# Patient Record
Sex: Male | Born: 1995 | Race: White | Hispanic: No | Marital: Single | Smoking: Never smoker
Health system: Southern US, Community
[De-identification: ages and names within clinical notes are randomized; demographics above are authoritative.]

## PROBLEM LIST (undated history)

## (undated) DIAGNOSIS — T7840XA Allergy, unspecified, initial encounter: Secondary | ICD-10-CM

## (undated) HISTORY — DX: Allergy, unspecified, initial encounter: T78.40XA

---
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
## 2015-09-20 ENCOUNTER — Encounter (HOSPITAL_COMMUNITY): Payer: Self-pay | Admitting: *Deleted

## 2015-09-20 ENCOUNTER — Emergency Department (HOSPITAL_COMMUNITY)
Admission: EM | Admit: 2015-09-20 | Discharge: 2015-09-20 | Disposition: A | Discharge status: Home / Self Care | Payer: Self-pay | Attending: Emergency Medicine | Admitting: Emergency Medicine

## 2015-09-20 DIAGNOSIS — B354 Tinea corporis: Secondary | ICD-10-CM | POA: Insufficient documentation

## 2015-09-20 MED ORDER — NAFTIFINE HCL 2 % EX CREA: TOPICAL_CREAM | CUTANEOUS | Status: DC

## 2015-09-20 MED ORDER — TRIAMCINOLONE ACETONIDE 0.1 % EX CREA: 1.0000 "application " | TOPICAL_CREAM | Freq: Two times a day (BID) | CUTANEOUS | Status: DC

## 2015-09-20 NOTE — ED Notes (Signed)
The pt reports that hed has had ring worm for 3 weeks on his rt wrist

## 2015-09-20 NOTE — Discharge Instructions (Signed)
Apply antifungal cream as prescribed including a half an inch margin around the wound daily. Apply steroid cream for 3-5 days to help with inflammation. Do not use longer than that. Follow-up with a family doctor or dermatologist for recheck   Body Ringworm Ringworm (tinea corporis) is a fungal infection of the skin on the body. This infection is not caused by worms, but is actually caused by a fungus. Fungus normally lives on the top of your skin and can be useful. However, in the case of ringworms, the fungus grows out of control and causes a skin infection. It can involve any area of skin on the body and can spread easily from one person to another (contagious). Ringworm is a common problem for children, but it can affect adults as well. Ringworm is also often found in athletes, especially wrestlers who share equipment and mats.  CAUSES  Ringworm of the body is caused by a fungus called dermatophyte. It can spread by:  Touchingother people who are infected.  Touchinginfected pets.  Touching or sharingobjects that have been in contact with the infected person or pet (hats, combs, towels, clothing, sports equipment). SYMPTOMS   Itchy, raised red spots and bumps on the skin.  Ring-shaped rash.  Redness near the border of the rash with a clear center.  Dry and scaly skin on or around the rash. Not every person develops a ring-shaped rash. Some develop only the red, scaly patches. DIAGNOSIS  Most often, ringworm can be diagnosed by performing a skin exam. Your caregiver may choose to take a skin scraping from the affected area. The sample will be examined under the microscope to see if the fungus is present.  TREATMENT  Body ringworm may be treated with a topical antifungal cream or ointment. Sometimes, an antifungal shampoo that can be used on your body is prescribed. You may be prescribed antifungal medicines to take by mouth if your ringworm is severe, keeps coming back, or lasts a long  time.  HOME CARE INSTRUCTIONS   Only take over-the-counter or prescription medicines as directed by your caregiver.  Wash the infected area and dry it completely before applying yourcream or ointment.  When using antifungal shampoo to treat the ringworm, leave the shampoo on the body for 3-5 minutes before rinsing.   Wear loose clothing to stop clothes from rubbing and irritating the rash.  Wash or change your bed sheets every night while you have the rash.  Have your pet treated by your veterinarian if it has the same infection. To prevent ringworm:   Practice good hygiene.  Wear sandals or shoes in public places and showers.  Do not share personal items with others.  Avoid touching red patches of skin on other people.  Avoid touching pets that have bald spots or wash your hands after doing so. SEEK MEDICAL CARE IF:   Your rash continues to spread after 7 days of treatment.  Your rash is not gone in 4 weeks.  The area around your rash becomes red, warm, tender, and swollen.   This information is not intended to replace advice given to you by your health care provider. Make sure you discuss any questions you have with your health care provider.   Document Released: 07/11/2000 Document Revised: 04/07/2012 Document Reviewed: 01/26/2012 Elsevier Interactive Patient Education [employer]

## 2015-09-20 NOTE — ED Provider Notes (Signed)
CSN: [id number]     Arrival date & time 09/20/15  2150 History  By signing my name below, I, Tanda Rockers, attest that this documentation has been prepared under the direction and in the presence of Rathana Viveros, PA-C.  Electronically Signed: Tanda Rockers, ED Scribe. 09/20/2015. 10:39 PM.   Chief Complaint  Patient presents with  . Rash   The history is provided by the patient. No language interpreter was used.     HPI Comments: Norman Flynn is a 20 y.o. male who presents to the Emergency Department complaining of gradual onset, constant, itching rash to right wrist x 3 weeks. Pt believes he had ring worm due to one of his friend's having a similar rash in the past. He spoke to a pharmacist at [facility] and was given OTC fungal foot cream to use. He states he has been placing the cream to the area multiples time per day for 3 weeks and has been taking Amoxicillin without relief. Denies drainage, fever, chills, or any other associated symptoms.   History reviewed. No pertinent past medical history. History reviewed. No pertinent past surgical history. No family history on file. Social History  Substance Use Topics  . Smoking status: Never Smoker   . Smokeless tobacco: None  . Alcohol Use: No    Review of Systems  Constitutional: Negative for fever and chills.  Skin: Positive for rash (right wrist).       Negative for drainage   Allergies  Review of patient's allergies indicates no known allergies.  Home Medications   Prior to Admission medications   Not on File   BP 151/90 mmHg  Pulse 76  Temp(Src) 98.4 F (36.9 C)  Resp 16  Ht 6' (1.829 m)  Wt 260 lb 3 oz (118.02 kg)  BMI 35.28 kg/m2  SpO2 98%   Physical Exam  Constitutional: He is oriented to person, place, and time. He appears well-developed and well-nourished. No distress.  HENT:  Head: Normocephalic and atraumatic.  Eyes: Conjunctivae and EOM are normal.  Neck: Neck supple. No tracheal deviation present.   Cardiovascular: Normal rate, regular rhythm and normal heart sounds.   Pulmonary/Chest: Effort normal and breath sounds normal. No respiratory distress. He has no wheezes. He has no rales.  Musculoskeletal: Normal range of motion.  Neurological: He is alert and oriented to person, place, and time.  Skin: Skin is warm and dry.  5x5 cm erythematous scaly rash to the dorsal distal right forearm. Area is erythematous, scaly, with well demarcated border. There is tiny satellite lesions around the wound. It is nontender. No drainage. Not warm to the touch.  Psychiatric: He has a normal mood and affect. His behavior is normal.  Nursing note and vitals reviewed.   ED Course  Procedures (including critical care time)  DIAGNOSTIC STUDIES: Oxygen Saturation is 98% on RA, normal by my interpretation.    COORDINATION OF CARE: 10:39 PM-Discussed treatment plan with pt at bedside and pt agreed to plan.   Labs Review Labs Reviewed - No data to display  Imaging Review No results found.   EKG Interpretation None      MDM   Final diagnoses:  Tinea corporis   Patients with a rash to the right dorsal wrist consistent with tinea corporis. Will start on antifungal cream, Kenalog cream, follow-up with primary care doctor or dermatology. There is no evidence of infectious process, no evidence of cellulitis, no drainage. Wound is itchy.  Filed Vitals:   09/20/15 2204  BP:  151/90  Pulse: 76  Temp: 98.4 F (36.9 C)  Resp: 16  Height: 6' (1.829 m)  Weight: 118.02 kg  SpO2: 98%     Jaynie Crumble, PA-C 09/20/15 2257  Nelva Nay, MD 09/24/15 1344

## 2016-05-20 ENCOUNTER — Emergency Department: Payer: Worker's Compensation

## 2016-05-20 ENCOUNTER — Emergency Department
Admission: EM | Admit: 2016-05-20 | Discharge: 2016-05-20 | Disposition: A | Discharge status: Home / Self Care | Payer: Worker's Compensation | Attending: Emergency Medicine | Admitting: Emergency Medicine

## 2016-05-20 DIAGNOSIS — S4991XA Unspecified injury of right shoulder and upper arm, initial encounter: Secondary | ICD-10-CM | POA: Diagnosis present

## 2016-05-20 DIAGNOSIS — Y9389 Activity, other specified: Secondary | ICD-10-CM | POA: Insufficient documentation

## 2016-05-20 DIAGNOSIS — Z87828 Personal history of other (healed) physical injury and trauma: Secondary | ICD-10-CM | POA: Insufficient documentation

## 2016-05-20 DIAGNOSIS — M25511 Pain in right shoulder: Secondary | ICD-10-CM

## 2016-05-20 DIAGNOSIS — Y929 Unspecified place or not applicable: Secondary | ICD-10-CM | POA: Insufficient documentation

## 2016-05-20 DIAGNOSIS — Y999 Unspecified external cause status: Secondary | ICD-10-CM | POA: Diagnosis not present

## 2016-05-20 MED ORDER — KETOROLAC TROMETHAMINE 60 MG/2ML IM SOLN
INTRAMUSCULAR | Status: AC
  Administered 2016-05-20: 60 mg via INTRAMUSCULAR
  Filled 2016-05-20: qty 2

## 2016-05-20 MED ORDER — KETOROLAC TROMETHAMINE 60 MG/2ML IM SOLN
60.0000 mg | Freq: Once | INTRAMUSCULAR | Status: AC
  Administered 2016-05-20: 60 mg via INTRAMUSCULAR

## 2016-05-20 NOTE — ED Notes (Signed)
Pt discharged to home.  Discharge instructions reviewed.  Verbalized understanding.  No questions or concerns at this time.  Teach back verified.  Pt in NAD.  No items left in ED.   
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
## 2016-12-27 ENCOUNTER — Encounter (HOSPITAL_COMMUNITY): Payer: Self-pay | Admitting: *Deleted

## 2016-12-27 ENCOUNTER — Ambulatory Visit (HOSPITAL_COMMUNITY)
Admission: EM | Admit: 2016-12-27 | Discharge: 2016-12-27 | Disposition: A | Discharge status: Home / Self Care | Payer: BLUE CROSS/BLUE SHIELD | Attending: Internal Medicine | Admitting: Internal Medicine

## 2016-12-27 DIAGNOSIS — L500 Allergic urticaria: Secondary | ICD-10-CM | POA: Diagnosis not present

## 2016-12-27 DIAGNOSIS — R21 Rash and other nonspecific skin eruption: Secondary | ICD-10-CM | POA: Diagnosis not present

## 2016-12-27 MED ORDER — METHYLPREDNISOLONE SODIUM SUCC 125 MG IJ SOLR
125.0000 mg | Freq: Once | INTRAMUSCULAR | Status: AC
  Administered 2016-12-27: 125 mg via INTRAMUSCULAR

## 2016-12-27 MED ORDER — METHYLPREDNISOLONE SODIUM SUCC 125 MG IJ SOLR
INTRAMUSCULAR | Status: AC
  Filled 2016-12-27: qty 2

## 2016-12-27 MED ORDER — DIPHENHYDRAMINE HCL 50 MG/ML IJ SOLN
INTRAMUSCULAR | Status: AC
  Filled 2016-12-27: qty 1

## 2016-12-27 MED ORDER — HYDROXYZINE HCL 25 MG PO TABS: ORAL_TABLET | ORAL | 0 refills | Status: DC

## 2016-12-27 MED ORDER — DIPHENHYDRAMINE HCL 50 MG/ML IJ SOLN
50.0000 mg | Freq: Once | INTRAMUSCULAR | Status: AC
  Administered 2016-12-27: 50 mg via INTRAMUSCULAR

## 2016-12-27 MED ORDER — PREDNISONE 10 MG PO TABS: ORAL_TABLET | ORAL | 0 refills | Status: DC

## 2016-12-27 NOTE — ED Provider Notes (Signed)
CSN: [id number]     Arrival date & time 12/27/16  1708 History   First MD Initiated Contact with Patient 12/27/16 1732     Chief Complaint  Patient presents with  . Urticaria   (Consider location/radiation/quality/duration/timing/severity/associated sxs/prior Treatment) Patient awoke this afternoon c/o welts itching and rash on back.   The history is provided by the patient.  Urticaria  This is a new problem. The problem occurs constantly. The problem has not changed since onset.Nothing aggravates the symptoms. He has tried nothing for the symptoms.    History reviewed. No pertinent past medical history. History reviewed. No pertinent surgical history. No family history on file. Social History  Substance Use Topics  . Smoking status: Never Smoker  . Smokeless tobacco: Never Used  . Alcohol use No    Review of Systems  Constitutional: Negative.   HENT: Negative.   Eyes: Negative.   Respiratory: Negative.   Cardiovascular: Negative.   Gastrointestinal: Negative.   Endocrine: Negative.   Genitourinary: Negative.   Musculoskeletal: Negative.   Skin: Positive for rash.  Allergic/Immunologic: Negative.   Neurological: Negative.   Hematological: Negative.   Psychiatric/Behavioral: Negative.     Allergies  Other  Home Medications   Prior to Admission medications   Medication Sig Start Date End Date Taking? Authorizing Provider  hydrOXYzine (ATARAX/VISTARIL) 25 MG tablet Take one to two po q 6 hours prn itching 12/27/16   Deatra Canterxford, Rustyn Conery J, FNP  Naftifine HCl 2 % CREA Apply topically 1-2 times daily, including 0.5in margin around the wound 09/20/15   Kirichenko, Lemont Fillersatyana, PA-C  predniSONE (DELTASONE) 10 MG tablet Take 6-5-4-3-2-1 po qd 12/27/16   Deatra Canterxford, Ford Peddie J, FNP  triamcinolone cream (KENALOG) 0.1 % Apply 1 application topically 2 (two) times daily. 09/20/15   Kirichenko, Lemont Fillersatyana, PA-C   Meds Ordered and Administered this Visit   Medications  methylPREDNISolone sodium  succinate (SOLU-MEDROL) 125 mg/2 mL injection 125 mg (not administered)  diphenhydrAMINE (BENADRYL) injection 50 mg (not administered)    BP (!) 141/79   Pulse 63   Temp 98.3 F (36.8 C) (Oral)   Resp 16   SpO2 96%  No data found.   Physical Exam  Constitutional: He appears well-developed and well-nourished.  HENT:  Head: Normocephalic and atraumatic.  Eyes: Conjunctivae and EOM are normal. Pupils are equal, round, and reactive to light.  Neck: Normal range of motion.  Cardiovascular: Normal rate, regular rhythm and normal heart sounds.   Pulmonary/Chest: Effort normal and breath sounds normal.  Skin:  Urticaria rash on back  Nursing note and vitals reviewed.   Urgent Care Course     Procedures (including critical care time)  Labs Review Labs Reviewed - No data to display  Imaging Review No results found.   Visual Acuity Review  Right Eye Distance:   Left Eye Distance:   Bilateral Distance:    Right Eye Near:   Left Eye Near:    Bilateral Near:         MDM   1. Allergic urticaria    Solumedrol 125mg  IM Benadryl 50mg  IM  Prednisone 10 mg 6-5-4-3-2-1 po qd #21 Hydroxyzine 25mg  one to two po qid prn #28      Deatra CanterOxford, Giovannina Mun J, FNP 12/27/16 1753

## 2016-12-27 NOTE — ED Triage Notes (Signed)
C/O waking up noticing hives to back and shoulder this AM.

## 2017-02-01 ENCOUNTER — Ambulatory Visit (HOSPITAL_COMMUNITY)
Admission: EM | Admit: 2017-02-01 | Discharge: 2017-02-01 | Disposition: A | Discharge status: Home / Self Care | Payer: Worker's Compensation | Attending: Family Medicine | Admitting: Family Medicine

## 2017-02-01 ENCOUNTER — Encounter (HOSPITAL_COMMUNITY): Payer: Self-pay | Admitting: Emergency Medicine

## 2017-02-01 DIAGNOSIS — S4991XA Unspecified injury of right shoulder and upper arm, initial encounter: Secondary | ICD-10-CM
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
## 2017-02-01 NOTE — Discharge Instructions (Addendum)
Today you were diagnosed with the following: 1. Shoulder injury, right, initial encounter    You have not been prescribed prescription medications this visit.  Please call [facility] for follow up appointment. You may use over the counter ibuprofen or acetaminophen as needed.

## 2017-09-20 DIAGNOSIS — H6693 Otitis media, unspecified, bilateral: Secondary | ICD-10-CM | POA: Diagnosis not present

## 2017-09-23 DIAGNOSIS — H6693 Otitis media, unspecified, bilateral: Secondary | ICD-10-CM | POA: Diagnosis not present

## 2017-11-11 ENCOUNTER — Ambulatory Visit: Payer: Self-pay | Admitting: Internal Medicine

## 2017-12-18 ENCOUNTER — Ambulatory Visit (INDEPENDENT_AMBULATORY_CARE_PROVIDER_SITE_OTHER): Payer: 59 | Admitting: Family Medicine

## 2017-12-18 ENCOUNTER — Encounter: Payer: Self-pay | Admitting: Family Medicine

## 2017-12-18 VITALS — BP 134/88 | HR 73 | Temp 98.5°F | Ht 69.75 in | Wt 262.0 lb

## 2017-12-18 DIAGNOSIS — Z6837 Body mass index (BMI) 37.0-37.9, adult: Secondary | ICD-10-CM

## 2017-12-18 DIAGNOSIS — Z8249 Family history of ischemic heart disease and other diseases of the circulatory system: Secondary | ICD-10-CM

## 2017-12-18 DIAGNOSIS — Z8659 Personal history of other mental and behavioral disorders: Secondary | ICD-10-CM

## 2017-12-18 DIAGNOSIS — E6609 Other obesity due to excess calories: Secondary | ICD-10-CM | POA: Diagnosis not present

## 2017-12-18 DIAGNOSIS — Z0000 Encounter for general adult medical examination without abnormal findings: Secondary | ICD-10-CM

## 2017-12-18 NOTE — Progress Notes (Signed)
Subjective:    Patient ID: Norman Flynn, male    DOB: 06/17/1996, 22 y.o.   MRN: [id number]  HPI This is a 22 yo male who presents today to establish care. 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   He was diagnosed with ADHD as child, took vyvanse. Is interested in being retested to see if medication would help his inattentiveness.   Last CPE- several years Td- 02/24/13 Flu- annual Dental- regular Eye- eye exam for work Exercise-4-5 days a week Sleep- 6-8 hours a night Sexuality- has not been sexually active in over 3 years Diet- eats fast food approximately 2x per week. Tries to prepare most meals. No soda/juice/sweet tea. Frequently skips meals.   Past Medical History:  Diagnosis Date  . Allergy    No past surgical history on file. Family History  Problem Relation Age of Onset  . Cancer Mother   . Heart disease Father    Social History   Tobacco Use  . Smoking status: Never Smoker  . Smokeless tobacco: Never Used  Substance Use Topics  . Alcohol use: No  . Drug use: No      Review of Systems  Allergic/Immunologic: Positive for environmental allergies.  Neurological:       Difficulty concentrating.   All other systems reviewed and are negative.      Objective:   Physical Exam Physical Exam  Constitutional: He is oriented to person, place, and time. He appears well-developed and well-nourished.  HENT:  Head: Normocephalic and atraumatic.  Right Ear: External ear normal.  Left Ear: External ear normal.  Nose: Nose normal.  Mouth/Throat: Oropharynx is clear and moist.  Eyes: Conjunctivae are normal. Pupils are equal, round, and reactive to light.  Neck: Normal range of motion. Neck supple.  Cardiovascular: Normal rate, regular rhythm, normal heart sounds and intact distal pulses.   Pulmonary/Chest: Effort normal and breath sounds normal.  Abdominal: Soft. Bowel sounds are normal. Hernia confirmed negative in the right inguinal area and  confirmed negative in the left inguinal area.  Genitourinary: Testes normal and penis normal. Circumcised.  Musculoskeletal: Normal range of motion. He exhibits no edema or tenderness.       Cervical back: Normal.       Thoracic back: Normal.       Lumbar back: Normal.  Lymphadenopathy:    He has no cervical adenopathy.       Right: No inguinal adenopathy present.       Left: No inguinal adenopathy present.  Neurological: He is alert and oriented to person, place, and time. He has normal reflexes.  Skin: Skin is warm and dry.  Psychiatric: He has a normal mood and affect. His behavior is normal. Judgment normal.  Vitals reviewed.     BP 134/88 (BP Location: Right Arm, Patient Position: Sitting, Cuff Size: Large)   Pulse 73   Temp 98.5 F (36.9 C) (Oral)   Ht 5' 9.75" (1.772 m)   Wt 262 lb (118.8 kg)   SpO2 97%   BMI 37.86 kg/m  Wt Readings from Last 3 Encounters:  12/18/17 262 lb (118.8 kg)  09/20/15 260 lb 3 oz (118 kg)       Assessment & Plan:  1. Annual physical exam - Discussed and encouraged healthy lifestyle choices- adequate sleep, regular exercise, stress management and healthy food choices.  - Comprehensive metabolic panel; Future - Lipid panel; Future - TSH; Future - Hemoglobin A1c; Future  2. History of ADHD -  Ambulatory referral to Psychology- for psycho-educational testing  3. Class 2 obesity due to excess calories without serious comorbidity with body mass index (BMI) of 37.0 to 37.9 in adult - discussed making healthy food choices, increasing vegetables, lean proteins, regular meals/snacks  4. Family history of early CAD - labs per #1   Olean Ree, FNP-BC  [facility]  12/18/2017 5:27 PM

## 2017-12-18 NOTE — Patient Instructions (Signed)
It was a pleasure to meet you today! I look forward to partnering with you for your health care needs   Please stop at the desk to schedule testing for ADHD- follow up with me after testing  Increase your vegetable intake, try to spread calories throughout day  Keeping you healthy  Get these tests  Blood pressure- Have your blood pressure checked once a year by your healthcare provider.  Normal blood pressure is 120/80.  Weight- Have your body mass index (BMI) calculated to screen for obesity.  BMI is a measure of body fat based on height and weight. You can also calculate your own BMI at https://www.west-esparza.com/.  Cholesterol- Have your cholesterol checked regularly starting at age 18, sooner may be necessary if you have diabetes, high blood pressure, if a family member developed heart diseases at an early age or if you smoke.   Chlamydia, HIV, and other sexual transmitted disease- Get screened each year until the age of 46 then within three months of each new sexual partner.  Diabetes- Have your blood sugar checked regularly if you have high blood pressure, high cholesterol, a family history of diabetes or if you are overweight.  Get these vaccines  Flu shot- Every fall.  Tetanus shot- Every 10 years.  Menactra- Single dose; prevents meningitis.  Take these steps  Don't smoke- If you do smoke, ask your healthcare provider about quitting. For tips on how to quit, go to www.smokefree.gov or call 1-800-QUIT-NOW.  Be physically active- Exercise 5 days a week for at least 30 minutes.  If you are not already physically active start slow and gradually work up to 30 minutes of moderate physical activity.  Examples of moderate activity include walking briskly, mowing the yard, dancing, swimming bicycling, etc.  Eat a healthy diet- Eat a variety of healthy foods such as fruits, vegetables, low fat milk, low fat cheese, yogurt, lean meats, poultry, fish, beans, tofu, etc.  For more  information on healthy eating, go to www.thenutritionsource.org  Drink alcohol in moderation- Limit alcohol intake two drinks or less a day.  Never drink and drive.  Dentist- Brush and floss teeth twice daily; visit your dentis twice a year.  Depression-Your emotional health is as important as your physical health.  If you're feeling down, losing interest in things you normally enjoy please talk with your healthcare provider.  Gun Safety- If you keep a gun in your home, keep it unloaded and with the safety lock on.  Bullets should be stored separately.  Helmet use- Always wear a helmet when riding a motorcycle, bicycle, rollerblading or skateboarding.  Safe sex- If you may be exposed to a sexually transmitted infection, use a condom  Seat belts- Seat bels can save your life; always wear one.  Smoke/Carbon Monoxide detectors- These detectors need to be installed on the appropriate level of your home.  Replace batteries at least once a year.  Skin Cancer- When out in the sun, cover up and use sunscreen SPF 15 or higher.  Violence- If anyone is threatening or hurting you, please tell your healthcare provider.

## 2017-12-23 ENCOUNTER — Other Ambulatory Visit: Payer: 59

## 2017-12-24 ENCOUNTER — Other Ambulatory Visit: Payer: 59

## 2018-01-26 ENCOUNTER — Other Ambulatory Visit (INDEPENDENT_AMBULATORY_CARE_PROVIDER_SITE_OTHER): Payer: 59

## 2018-01-26 DIAGNOSIS — Z0000 Encounter for general adult medical examination without abnormal findings: Secondary | ICD-10-CM | POA: Diagnosis not present

## 2018-01-27 LAB — LIPID PANEL
CHOLESTEROL: 261 mg/dL — AB (ref 0–200)
HDL: 58.3 mg/dL (ref >39.00)
LDL Cholesterol: 183 mg/dL — ABNORMAL HIGH (ref 0–99)
NONHDL: 203.05
Total CHOL/HDL Ratio: 4
Triglycerides: 99 mg/dL (ref 0.0–149.0)
VLDL: 19.8 mg/dL (ref 0.0–40.0)

## 2018-01-27 LAB — COMPREHENSIVE METABOLIC PANEL
ALK PHOS: 70 U/L (ref 39–117)
ALT: 28 U/L (ref 0–53)
AST: 19 U/L (ref 0–37)
Albumin: 4.6 g/dL (ref 3.5–5.2)
BILIRUBIN TOTAL: 0.5 mg/dL (ref 0.2–1.2)
BUN: 13 mg/dL (ref 6–23)
CO2: 29 mEq/L (ref 19–32)
Calcium: 9.8 mg/dL (ref 8.4–10.5)
Chloride: 101 mEq/L (ref 96–112)
Creatinine, Ser: 0.89 mg/dL (ref 0.40–1.50)
GFR: 112.66 mL/min (ref >60.00)
GLUCOSE: 93 mg/dL (ref 70–99)
Potassium: 4.5 mEq/L (ref 3.5–5.1)
Sodium: 139 mEq/L (ref 135–145)
TOTAL PROTEIN: 7.3 g/dL (ref 6.0–8.3)

## 2018-01-27 LAB — HEMOGLOBIN A1C: Hgb A1c MFr Bld: 5.6 % (ref 4.6–6.5)

## 2018-01-27 LAB — TSH: TSH: 2.65 u[IU]/mL (ref 0.35–4.50)

## 2018-03-30 ENCOUNTER — Ambulatory Visit: Payer: 59 | Admitting: Psychology

## 2018-04-09 ENCOUNTER — Ambulatory Visit: Payer: Self-pay | Admitting: Family Medicine

## 2018-07-08 ENCOUNTER — Encounter: Payer: Self-pay | Admitting: Family Medicine

## 2018-07-13 ENCOUNTER — Ambulatory Visit: Payer: 59 | Admitting: Internal Medicine

## 2018-07-13 DIAGNOSIS — Z0289 Encounter for other administrative examinations: Secondary | ICD-10-CM

## 2018-10-07 ENCOUNTER — Emergency Department (HOSPITAL_COMMUNITY): Payer: No Typology Code available for payment source

## 2018-10-07 ENCOUNTER — Emergency Department (HOSPITAL_COMMUNITY)
Admission: EM | Admit: 2018-10-07 | Discharge: 2018-10-08 | Disposition: A | Discharge status: Home / Self Care | Payer: No Typology Code available for payment source | Attending: Emergency Medicine | Admitting: Emergency Medicine

## 2018-10-07 ENCOUNTER — Encounter (HOSPITAL_COMMUNITY): Payer: Self-pay

## 2018-10-07 DIAGNOSIS — W19XXXA Unspecified fall, initial encounter: Secondary | ICD-10-CM | POA: Insufficient documentation

## 2018-10-07 DIAGNOSIS — Y990 Civilian activity done for income or pay: Secondary | ICD-10-CM | POA: Insufficient documentation

## 2018-10-07 DIAGNOSIS — S60819A Abrasion of unspecified wrist, initial encounter: Secondary | ICD-10-CM | POA: Insufficient documentation

## 2018-10-07 DIAGNOSIS — S6990XA Unspecified injury of unspecified wrist, hand and finger(s), initial encounter: Secondary | ICD-10-CM | POA: Diagnosis present

## 2018-10-07 DIAGNOSIS — Y929 Unspecified place or not applicable: Secondary | ICD-10-CM | POA: Insufficient documentation

## 2018-10-07 DIAGNOSIS — Z7721 Contact with and (suspected) exposure to potentially hazardous body fluids: Secondary | ICD-10-CM | POA: Diagnosis not present

## 2018-10-07 DIAGNOSIS — Z79899 Other long term (current) drug therapy: Secondary | ICD-10-CM | POA: Diagnosis not present

## 2018-10-07 DIAGNOSIS — Y9389 Activity, other specified: Secondary | ICD-10-CM | POA: Diagnosis not present

## 2018-10-07 NOTE — ED Notes (Signed)
Bed: [facility] Expected date:  Expected time:  Means of arrival:  Comments: 
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
## 2018-10-08 ENCOUNTER — Other Ambulatory Visit: Payer: Self-pay

## 2018-10-08 LAB — COMPREHENSIVE METABOLIC PANEL
ALBUMIN: 4.6 g/dL (ref 3.5–5.0)
ALK PHOS: 67 U/L (ref 38–126)
ALT: 29 U/L (ref 0–44)
ANION GAP: 13 (ref 5–15)
AST: 26 U/L (ref 15–41)
BUN: 13 mg/dL (ref 6–20)
CALCIUM: 9.6 mg/dL (ref 8.9–10.3)
CHLORIDE: 106 mmol/L (ref 98–111)
CO2: 19 mmol/L — AB (ref 22–32)
Creatinine, Ser: 0.9 mg/dL (ref 0.61–1.24)
GFR calc Af Amer: >60 mL/min (ref >60)
GFR calc non Af Amer: >60 mL/min (ref >60)
GLUCOSE: 99 mg/dL (ref 70–99)
Potassium: 3.9 mmol/L (ref 3.5–5.1)
SODIUM: 138 mmol/L (ref 135–145)
Total Bilirubin: 0.6 mg/dL (ref 0.3–1.2)
Total Protein: 7.7 g/dL (ref 6.5–8.1)

## 2018-10-08 LAB — RPR: RPR Ser Ql: NONREACTIVE

## 2018-10-08 LAB — RAPID HIV SCREEN (HIV 1/2 AB+AG)
HIV 1/2 Antibodies: NONREACTIVE
HIV-1 P24 Antigen - HIV24: NONREACTIVE

## 2018-10-08 LAB — HEPATITIS C ANTIBODY: HCV Ab: <0.1 s/co ratio (ref 0.0–0.9)

## 2018-10-08 LAB — HEPATITIS C ANTIBODY (REFLEX): HCV Ab: <0.1 s/co ratio (ref 0.0–0.9)

## 2018-10-08 LAB — HEPATITIS B SURFACE ANTIGEN: Hepatitis B Surface Ag: NEGATIVE

## 2018-10-08 LAB — HCV COMMENT:

## 2018-10-08 MED ORDER — ELVITEG-COBIC-EMTRICIT-TENOFAF 150-150-200-10 MG PREPACK
5.0000 | ORAL_TABLET | Freq: Once | ORAL | Status: AC
  Administered 2018-10-08: 5 via ORAL
  Filled 2018-10-08: qty 1
  Filled 2018-10-08: qty 5

## 2018-10-08 MED ORDER — ELVITEG-COBIC-EMTRICIT-TENOFAF 150-150-200-10 MG PO TABS: 1.0000 | ORAL_TABLET | Freq: Every day | ORAL | 0 refills | Status: DC

## 2018-10-08 NOTE — Discharge Instructions (Addendum)
You will need to follow-up on your lab results in 2 days.  Take the medication as directed.  Please follow-up at the infectious disease clinic or your doctor.
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
## 2019-05-29 ENCOUNTER — Encounter: Payer: Self-pay | Admitting: Family Medicine

## 2019-06-03 ENCOUNTER — Encounter: Payer: Self-pay | Admitting: Family Medicine

## 2019-06-03 ENCOUNTER — Ambulatory Visit: Payer: 59 | Admitting: Family Medicine

## 2019-06-03 ENCOUNTER — Other Ambulatory Visit: Payer: Self-pay

## 2019-06-03 VITALS — BP 130/84 | HR 72 | Temp 98.0°F | Wt 272.0 lb

## 2019-06-03 DIAGNOSIS — K529 Noninfective gastroenteritis and colitis, unspecified: Secondary | ICD-10-CM

## 2019-06-03 LAB — COMPREHENSIVE METABOLIC PANEL
ALT: 22 U/L (ref 0–53)
AST: 14 U/L (ref 0–37)
Albumin: 4.6 g/dL (ref 3.5–5.2)
Alkaline Phosphatase: 67 U/L (ref 39–117)
BUN: 13 mg/dL (ref 6–23)
CO2: 27 mEq/L (ref 19–32)
Calcium: 9.6 mg/dL (ref 8.4–10.5)
Chloride: 102 mEq/L (ref 96–112)
Creatinine, Ser: 0.84 mg/dL (ref 0.40–1.50)
GFR: 112.01 mL/min (ref >60.00)
Glucose, Bld: 110 mg/dL — ABNORMAL HIGH (ref 70–99)
Potassium: 4.6 mEq/L (ref 3.5–5.1)
Sodium: 139 mEq/L (ref 135–145)
Total Bilirubin: 0.4 mg/dL (ref 0.2–1.2)
Total Protein: 6.9 g/dL (ref 6.0–8.3)

## 2019-06-03 LAB — CBC WITH DIFFERENTIAL/PLATELET
Basophils Absolute: 0.1 10*3/uL (ref 0.0–0.1)
Basophils Relative: 0.9 % (ref 0.0–3.0)
Eosinophils Absolute: 0.2 10*3/uL (ref 0.0–0.7)
Eosinophils Relative: 3.1 % (ref 0.0–5.0)
HCT: 45.8 % (ref 39.0–52.0)
Hemoglobin: 15.4 g/dL (ref 13.0–17.0)
Lymphocytes Relative: 33.8 % (ref 12.0–46.0)
Lymphs Abs: 2.3 10*3/uL (ref 0.7–4.0)
MCHC: 33.7 g/dL (ref 30.0–36.0)
MCV: 90.7 fl (ref 78.0–100.0)
Monocytes Absolute: 0.6 10*3/uL (ref 0.1–1.0)
Monocytes Relative: 8.9 % (ref 3.0–12.0)
Neutro Abs: 3.6 10*3/uL (ref 1.4–7.7)
Neutrophils Relative %: 53.3 % (ref 43.0–77.0)
Platelets: 208 10*3/uL (ref 150.0–400.0)
RBC: 5.05 Mil/uL (ref 4.22–5.81)
RDW: 13.2 % (ref 11.5–15.5)
WBC: 6.8 10*3/uL (ref 4.0–10.5)

## 2019-06-03 LAB — HIGH SENSITIVITY CRP: CRP, High Sensitivity: 2.22 mg/L (ref 0.000–5.000)

## 2019-06-03 LAB — TSH: TSH: 3.7 u[IU]/mL (ref 0.35–4.50)

## 2019-06-03 LAB — IGA: IgA: 91 mg/dL (ref 68–378)

## 2019-06-03 NOTE — Progress Notes (Signed)
   Subjective:    Patient ID: Norman Flynn, male    DOB: 1995/11/06, 23 y.o.   MRN: [id number]  HPI Chief Complaint  Patient presents with  . Abdominal Pain    feeling bloated and increase in gas.... up to 7 loose stools daily... denies blood in stool..... denies N/V   This is a 23 yo male who presents today with above cc. Over last 4 months has had increased number of stools, some with urgency and he is unable to get to the restroom in time. Has taken immodium with short term relief. Abdominal pain which is mild and intermittent, some relief after BM. No blood. No nausea or vomiting. Has been little more fatigue lately but thinks this is related to increased stress and long work hours for [employer] department.  He denies any generalized muscle aches, joint pain or swelling.  Past Medical History:  Diagnosis Date  . Allergy    No past surgical history on file. Family History  Problem Relation Age of Onset  . Cancer Mother   . Heart disease Father    Social History   Tobacco Use  . Smoking status: Never Smoker  . Smokeless tobacco: Never Used  Substance Use Topics  . Alcohol use: No  . Drug use: No      Review of Systems Per HPI    Objective:   Physical Exam Vitals signs reviewed.  Constitutional:      General: He is not in acute distress.    Appearance: He is well-developed. He is obese. He is not ill-appearing, toxic-appearing or diaphoretic.  HENT:     Head: Normocephalic and atraumatic.  Eyes:     Conjunctiva/sclera: Conjunctivae normal.  Cardiovascular:     Rate and Rhythm: Normal rate.     Heart sounds: Normal heart sounds.  Pulmonary:     Effort: Pulmonary effort is normal.     Breath sounds: Normal breath sounds.  Abdominal:     General: There is no distension.     Palpations: There is no mass.     Tenderness: There is abdominal tenderness (generalized). There is no guarding or rebound.     Hernia: No hernia is present.  Skin:    General: Skin is  warm and dry.  Neurological:     Mental Status: He is alert and oriented to person, place, and time.  Psychiatric:        Mood and Affect: Mood normal.        Behavior: Behavior normal.        Thought Content: Thought content normal.        Judgment: Judgment normal.       BP 130/84   Pulse 72   Temp 98 F (36.7 C) (Temporal)   Wt 272 lb (123.4 kg)   SpO2 98%   BMI 39.31 kg/m  Wt Readings from Last 3 Encounters:  06/03/19 272 lb (123.4 kg)  12/18/17 262 lb (118.8 kg)  09/20/15 260 lb 3 oz (118 kg)       Assessment & Plan:  1. Chronic diarrhea - will check labs, stool studies, refer to GI as needed - Comprehensive metabolic panel - CBC with Differential - TSH - CRP High sensitivity - IGA - Gastrointestinal Pathogen Panel PCR - Celiac Pnl 2 rflx Endomysial Ab Ttr - CALPROTECTIN  - follow up in 3-4 months for CPE  Clarene Reamer, FNP-BC   Primary Care at [facility], [place]  06/03/2019 5:18 PM

## 2019-06-03 NOTE — Patient Instructions (Signed)
Good to see you today  I will notify you of lab results and next steps  Follow up in about 4 months for your annual physical exam  Stay safe and healthy!

## 2019-06-03 NOTE — Progress Notes (Signed)
   Subjective:    Patient ID: Norman Flynn, male    DOB: Oct 15, 1995, 23 y.o.   MRN: [id number]  Chief Complaint  Patient presents with  . Abdominal Pain    feeling bloated and increase in gas.... up to 7 loose stools daily... denies blood in stool..... denies N/V   HPI This is 23 yo cacausean man who comes to the office for acute visit due to abdominal pain. Pt reports that he start having abdominal  issues about 2 years ago Initially, he will have diarrhea once a month and it resolves spontaneously. However for the past 4 month the number of BM increased sometimes up to 7-8 a day. Pt states it is associated with abdominal pain 3/10. He tried to take imodium with moderate relief of symptoms for short amount of time. Pt states both his parents saw gastroenterologist for abdominal issues in the past.  Past Medical History:  Diagnosis Date  . Allergy   No past surgical history on file.  Family History  Problem Relation Age of Onset  . Cancer Mother   . Heart disease Father     Review of Systems  Constitutional: Negative for activity change, fatigue and fever.  Respiratory: Negative for chest tightness, shortness of breath and wheezing.   Cardiovascular: Negative for chest pain, palpitations and leg swelling.  Gastrointestinal: Positive for abdominal pain and diarrhea. Negative for blood in stool, nausea and vomiting.  Musculoskeletal: Negative.        Objective:   Physical Exam Constitutional:      General: He is not in acute distress.    Appearance: He is well-developed. He is obese. He is not ill-appearing.  HENT:     Head: Normocephalic and atraumatic.  Cardiovascular:     Rate and Rhythm: Normal rate and regular rhythm.     Pulses: Normal pulses.     Heart sounds: Normal heart sounds.  Pulmonary:     Effort: Pulmonary effort is normal. No respiratory distress.     Breath sounds: Normal breath sounds. No wheezing or rhonchi.  Abdominal:     General: Bowel sounds are  increased.     Palpations: Abdomen is soft.     Tenderness: There is generalized abdominal tenderness.     Comments: Urgency with BM  Musculoskeletal: Normal range of motion.  Skin:    General: Skin is warm and dry.     Findings: No rash.  Neurological:     General: No focal deficit present.     Mental Status: He is alert and oriented to person, place, and time. Mental status is at baseline.  Psychiatric:        Mood and Affect: Mood normal.        Thought Content: Thought content normal.        Assessment & Plan:   1. Chronic diarrhea - Comprehensive metabolic panel - CBC with Differential - TSH - CRP High sensitivity - IGA - Gastrointestinal Pathogen Panel PCR - Celiac Pnl 2 rflx Endomysial Ab Ttr - CALPROTECTIN

## 2019-06-10 LAB — CELIAC PNL 2 RFLX ENDOMYSIAL AB TTR
(tTG) Ab, IgA: <1 U/mL
(tTG) Ab, IgG: 3 U/mL
Endomysial Ab IgA: NEGATIVE
Gliadin IgA: 4 U (ref <20)
Gliadin IgG: 4 U (ref <20)
Immunoglobulin A: 92 mg/dL (ref 47–310)

## 2019-06-10 LAB — GASTROINTESTINAL PATHOGEN PANEL PCR
C. difficile Tox A/B, PCR: NOT DETECTED
Campylobacter, PCR: NOT DETECTED
Cryptosporidium, PCR: NOT DETECTED
E coli (ETEC) LT/ST PCR: NOT DETECTED
E coli (STEC) stx1/stx2, PCR: NOT DETECTED
E coli 0157, PCR: NOT DETECTED
Giardia lamblia, PCR: NOT DETECTED
Norovirus, PCR: NOT DETECTED
Rotavirus A, PCR: NOT DETECTED
Salmonella, PCR: NOT DETECTED
Shigella, PCR: NOT DETECTED

## 2019-06-10 LAB — CALPROTECTIN: Calprotectin: 13 mcg/g

## 2020-07-12 ENCOUNTER — Telehealth: Payer: Self-pay

## 2020-07-12 NOTE — Telephone Encounter (Signed)
Pt left v/m CP,back pain,when pt takes deep breath has sharp pain on lt side of chest. I spoke with pt;  Starting on 12:30 today pt developed lt sided dull to sharp CP that is consistent; when takes deep breath hurts in lt side of back and chest. No known injury and both sides of chest inflate equally. Pt does not have hx of heart or BP issues and does not have family hx of heart issues. Pt had nurse at school take BP 140/ something and oxygen level was 100 %. Pt said he is not hurting very badly now and talked with EMS personnel and said could be GI related and pt will take med for gas and if that does not help or if pain increases pt will go to UC. FYI to Harlin Heys FNP who is out of office and Dr Selena Batten who is in office.

## 2020-07-12 NOTE — Telephone Encounter (Signed)
Agree with trying medication for GI symptoms and ER or UC if not improivng

## 2020-07-13 NOTE — Telephone Encounter (Signed)
Agree with recommendations.  

## 2020-12-28 ENCOUNTER — Other Ambulatory Visit: Payer: Self-pay

## 2020-12-28 ENCOUNTER — Emergency Department
Admission: EM | Admit: 2020-12-28 | Discharge: 2020-12-28 | Disposition: A | Discharge status: Home / Self Care | Payer: 59 | Attending: Emergency Medicine | Admitting: Emergency Medicine

## 2020-12-28 ENCOUNTER — Encounter: Payer: Self-pay | Admitting: Emergency Medicine

## 2020-12-28 DIAGNOSIS — E860 Dehydration: Secondary | ICD-10-CM | POA: Insufficient documentation

## 2020-12-28 DIAGNOSIS — K529 Noninfective gastroenteritis and colitis, unspecified: Secondary | ICD-10-CM | POA: Insufficient documentation

## 2020-12-28 DIAGNOSIS — R197 Diarrhea, unspecified: Secondary | ICD-10-CM | POA: Diagnosis present

## 2020-12-28 LAB — GASTROINTESTINAL PANEL BY PCR, STOOL (REPLACES STOOL CULTURE)

## 2020-12-28 LAB — CBC
HCT: 49 % (ref 39.0–52.0)
Hemoglobin: 17.4 g/dL — ABNORMAL HIGH (ref 13.0–17.0)
MCH: 31 pg (ref 26.0–34.0)
MCHC: 35.5 g/dL (ref 30.0–36.0)
MCV: 87.3 fL (ref 80.0–100.0)
Platelets: 244 10*3/uL (ref 150–400)
RBC: 5.61 MIL/uL (ref 4.22–5.81)
RDW: 11.9 % (ref 11.5–15.5)
WBC: 7.3 10*3/uL (ref 4.0–10.5)
nRBC: 0 % (ref 0.0–0.2)

## 2020-12-28 LAB — URINALYSIS, COMPLETE (UACMP) WITH MICROSCOPIC
Bacteria, UA: NONE SEEN
Bilirubin Urine: NEGATIVE
Glucose, UA: NEGATIVE mg/dL
Hgb urine dipstick: NEGATIVE
Ketones, ur: NEGATIVE mg/dL
Leukocytes,Ua: NEGATIVE
Nitrite: NEGATIVE
Protein, ur: NEGATIVE mg/dL
Specific Gravity, Urine: 1.014 (ref 1.005–1.030)
Squamous Epithelial / HPF: NONE SEEN (ref 0–5)
WBC, UA: NONE SEEN WBC/hpf (ref 0–5)
pH: 5 (ref 5.0–8.0)

## 2020-12-28 LAB — COMPREHENSIVE METABOLIC PANEL
ALT: 30 U/L (ref 0–44)
AST: 22 U/L (ref 15–41)
Albumin: 4.7 g/dL (ref 3.5–5.0)
Alkaline Phosphatase: 78 U/L (ref 38–126)
Anion gap: 8 (ref 5–15)
BUN: 9 mg/dL (ref 6–20)
CO2: 25 mmol/L (ref 22–32)
Calcium: 8.9 mg/dL (ref 8.9–10.3)
Chloride: 102 mmol/L (ref 98–111)
Creatinine, Ser: 0.77 mg/dL (ref 0.61–1.24)
GFR, Estimated: >60 mL/min (ref >60)
Glucose, Bld: 97 mg/dL (ref 70–99)
Potassium: 3.8 mmol/L (ref 3.5–5.1)
Sodium: 135 mmol/L (ref 135–145)
Total Bilirubin: 0.7 mg/dL (ref 0.3–1.2)
Total Protein: 7.8 g/dL (ref 6.5–8.1)

## 2020-12-28 LAB — LIPASE, BLOOD: Lipase: 28 U/L (ref 11–51)

## 2020-12-28 MED ORDER — ONDANSETRON HCL 4 MG PO TABS: 4.0000 mg | ORAL_TABLET | Freq: Three times a day (TID) | ORAL | 0 refills | Status: AC | PRN

## 2020-12-28 MED ORDER — LACTATED RINGERS IV BOLUS: 1000.0000 mL | Freq: Once | INTRAVENOUS | Status: DC

## 2020-12-28 MED ORDER — LACTATED RINGERS IV BOLUS
2000.0000 mL | Freq: Once | INTRAVENOUS | Status: AC
  Administered 2020-12-28: 2000 mL via INTRAVENOUS

## 2020-12-28 MED ORDER — DICYCLOMINE HCL 10 MG PO CAPS: 10.0000 mg | ORAL_CAPSULE | Freq: Four times a day (QID) | ORAL | 0 refills | Status: AC

## 2020-12-28 MED ORDER — DICYCLOMINE HCL 10 MG PO CAPS
10.0000 mg | ORAL_CAPSULE | Freq: Once | ORAL | Status: AC
  Administered 2020-12-28: 10 mg via ORAL
  Filled 2020-12-28: qty 1

## 2020-12-28 MED ORDER — ONDANSETRON HCL 4 MG/2ML IJ SOLN
4.0000 mg | Freq: Once | INTRAMUSCULAR | Status: AC
  Administered 2020-12-28: 4 mg via INTRAVENOUS
  Filled 2020-12-28: qty 2

## 2020-12-28 NOTE — ED Provider Notes (Signed)
[facility] Emergency Department Provider Note  ____________________________________________   Event Date/Time   First MD Initiated Contact with Patient 12/28/20 1531     (approximate)  I have reviewed the triage vital signs and the nursing notes.   HISTORY  Chief Complaint Abdominal Pain   HPI Norman Flynn is a 25 y.o. male without significant past medical history who presents for assessment of being referred from urgent care for evaluation of approximately 5 days of nonbloody nonbilious vomiting, nonbloody diarrhea and some crampy abdominal pain.  Patient states he has been vomiting nonstop for the last 5 days and thinks he has lost 10 pounds.  He has tried over-the-counter antidiarrheals but did not help.  He denies any fevers, cough, shortness of breath, headache or earache, sore throat, back pain, urinary symptoms, rash or extremity pain.  No recent falls or injuries.  Denies EtOH or illicit drug use.  No other acute concerns at this time.         Past Medical History:  Diagnosis Date  . Allergy     There are no problems to display for this patient.   History reviewed. No pertinent surgical history.  Prior to Admission medications   Medication Sig Start Date End Date Taking? Authorizing Provider  dicyclomine (BENTYL) 10 MG capsule Take 1 capsule (10 mg total) by mouth 4 (four) times daily for 3 days. 12/28/20 12/31/20 Yes Gilles Chiquito, MD  ondansetron (ZOFRAN) 4 MG tablet Take 1 tablet (4 mg total) by mouth every 8 (eight) hours as needed for up to 10 doses for nausea or vomiting. 12/28/20  Yes Gilles Chiquito, MD  cetirizine (ZYRTEC) 10 MG tablet Take 10 mg by mouth daily.    [provider]  VYVANSE 60 MG capsule Take 60 mg by mouth every morning. 05/16/19   [provider]    Allergies Other  Family History  Problem Relation Age of Onset  . Cancer Mother   . Heart disease Father     Social History Social History    Tobacco Use  . Smoking status: Never Smoker  . Smokeless tobacco: Never Used  Substance Use Topics  . Alcohol use: No  . Drug use: No    Review of Systems  Review of Systems  Constitutional: Negative for chills and fever.  HENT: Negative for sore throat.   Eyes: Negative for pain.  Respiratory: Negative for cough and stridor.   Cardiovascular: Negative for chest pain.  Gastrointestinal: Positive for abdominal pain, diarrhea, nausea and vomiting.  Genitourinary: Negative for dysuria.  Musculoskeletal: Negative for myalgias.  Skin: Negative for rash.  Neurological: Negative for seizures, loss of consciousness and headaches.  Psychiatric/Behavioral: Negative for suicidal ideas.  All other systems reviewed and are negative.     ____________________________________________   PHYSICAL EXAM:  VITAL SIGNS: ED Triage Vitals  Enc Vitals Group     BP 12/28/20 1447 (!) 153/108     Pulse Rate 12/28/20 1447 63     Resp 12/28/20 1447 16     Temp 12/28/20 1447 98 F (36.7 C)     Temp src --      SpO2 12/28/20 1447 100 %     Weight 12/28/20 1354 272 lb 0.8 oz (123.4 kg)     Height 12/28/20 1354 5' 9.75" (1.772 m)     Head Circumference --      Peak Flow --      Pain Score 12/28/20 1353 5  Pain Loc --      Pain Edu? --      Excl. in GC? --    Vitals:   12/28/20 1447 12/28/20 1646  BP: (!) 153/108 (!) 134/94  Pulse: 63 65  Resp: 16 16  Temp: 98 F (36.7 C)   SpO2: 100% 100%   Physical Exam Vitals and nursing note reviewed.  Constitutional:      Appearance: He is well-developed.  HENT:     Head: Normocephalic and atraumatic.  Eyes:     Conjunctiva/sclera: Conjunctivae normal.  Cardiovascular:     Rate and Rhythm: Normal rate and regular rhythm.     Heart sounds: No murmur heard.   Pulmonary:     Effort: Pulmonary effort is normal. No respiratory distress.     Breath sounds: Normal breath sounds.  Abdominal:     Palpations: Abdomen is soft.      Tenderness: There is no abdominal tenderness. There is no right CVA tenderness, left CVA tenderness, guarding or rebound.  Musculoskeletal:     Cervical back: Neck supple.  Skin:    General: Skin is warm and dry.     Capillary Refill: Capillary refill takes less than 2 seconds.  Neurological:     General: No focal deficit present.     Mental Status: He is alert and oriented to person, place, and time.      ____________________________________________   LABS (all labs ordered are listed, but only abnormal results are displayed)  Labs Reviewed  CBC - Abnormal; Notable for the following components:      Result Value   Hemoglobin 17.4 (*)    All other components within normal limits  URINALYSIS, COMPLETE (UACMP) WITH MICROSCOPIC - Abnormal; Notable for the following components:   Color, Urine YELLOW (*)    APPearance CLEAR (*)    All other components within normal limits  GASTROINTESTINAL PANEL BY PCR, STOOL (REPLACES STOOL CULTURE)  RESP PANEL BY RT-PCR (FLU A&B, COVID) ARPGX2  LIPASE, BLOOD  COMPREHENSIVE METABOLIC PANEL   ____________________________________________  EKG  ____________________________________________  RADIOLOGY  ED MD interpretation:   Official radiology report(s): No results found.  ____________________________________________   PROCEDURES  Procedure(s) performed (including Critical Care):  Procedures   ____________________________________________   INITIAL IMPRESSION / ASSESSMENT AND PLAN / ED COURSE        Patient presents with above-stated history exam for assessment approximately 5 days of persistent nausea vomiting and diarrhea associate with some crampy abdominal pain.  He was referred from urgent care with concerns for clinically significant dehydration.  On arrival he is slightly hypertensive with a BP of 103/108 with low stable vital signs on room air.  He does appear quite dehydrated but his abdomen is soft nontender throughout  and he has no CVA tenderness.  Suspect dehydration secondary to acute infectious gastroenteritis.  No tenderness on exam or fever or leukocytosis on CBC to suggest acute appendicitis and no historical or exam features to suggest AAA kidney stone or cholecystitis.  Lipase not consistent with acute pancreatitis.  CMP otherwise shows no evidence of hepatitis cholestasis or any other significant electrode or metabolic derangements.  UA is unremarkable.  Patient declined influenza testing.  We will send GI pathogen panel.  Patient treated with IV fluids Zofran and Bentyl.  He is now able tolerate p.o. on reassessment.  Discharged stable condition.  Strict return precautions advised and discussed.  Course of Zofran and Bentyl written.  Discharged stable condition.  Strict return precautions advised and  discussed.  ____________________________________________   FINAL CLINICAL IMPRESSION(S) / ED DIAGNOSES  Final diagnoses:  Gastroenteritis  Dehydration    Medications  ondansetron (ZOFRAN) injection 4 mg (4 mg Intravenous Given 12/28/20 1636)  dicyclomine (BENTYL) capsule 10 mg (10 mg Oral Given 12/28/20 1645)  lactated ringers bolus 2,000 mL (2,000 mLs Intravenous New Bag/Given 12/28/20 1636)     ED Discharge Orders         Ordered    ondansetron (ZOFRAN) 4 MG tablet  Every 8 hours PRN        12/28/20 1706    dicyclomine (BENTYL) 10 MG capsule  4 times daily        12/28/20 1706           Note:  This document was prepared using Dragon voice recognition software and may include unintentional dictation errors.   Gilles Chiquito, MD 12/28/20 1710

## 2020-12-28 NOTE — ED Notes (Signed)
Patient refusing COVID swab. Patient states "I had that at [facility] in [place] today, it was negative." Provider notified.

## 2020-12-28 NOTE — ED Triage Notes (Signed)
Pt here abd pain that started Sunday with N, V, and D. Pt has lost 10 lbs so far. Pt has N/V non stop since Sunday.

## 2020-12-28 NOTE — ED Triage Notes (Signed)
Arrives from [facility] for 'iv fluids".  Patient with 3 day history of diarrhea.  AAOx3.  Skin warm and dry. NAD.  Ambulates with easy and steady gait.

## 2021-02-05 ENCOUNTER — Telehealth: Payer: Self-pay | Admitting: *Deleted

## 2021-02-05 NOTE — Telephone Encounter (Signed)
Patient called stating that he is in [place], [place] at a [employer] and will be there until Friday.. Patient stated that he was running a 5 K yesterday to raise money.  Patient stated while he was running he felt like something cracked in his left foot. Patient stated that he felt something like static in his foot and it went numb for about 20-30 minutes. Patient stated that his foot is no longer numb.  Patient stated that the pain level at times is a 6-7. Patient stated that he has access to a wheelchair and will probably be using that to stay off of his foot. Patient stated that he called an UC in the [place] area and was told that he would have to go to the ER. Patient stated that he really doesn't want to go to an ER. Patient was given information on [facility] in [place] that will see walk-in patients Monday-Friday 8:00-5:00. Patient appreciated the information Patient stated that he was not aware that his PCP had left the office but does plan on TOC with another provider in the office. Marland Kitchen

## 2021-02-06 NOTE — Telephone Encounter (Signed)
Noted, agreed.  Thanks. 

## 2021-02-08 ENCOUNTER — Telehealth: Payer: Self-pay

## 2021-02-08 NOTE — Telephone Encounter (Signed)
Pt called back to say he was back from [place] and was asking if he could come to the office early next week to be seen. I advised him our xray is down for the foreseeable future. I gave him info on [facility]. He will go there sometime soon.

## 2021-02-08 NOTE — Telephone Encounter (Signed)
Opened in error

## 2021-04-29 ENCOUNTER — Other Ambulatory Visit: Payer: Self-pay

## 2021-04-29 ENCOUNTER — Other Ambulatory Visit: Payer: Self-pay | Admitting: Family Medicine

## 2021-04-29 ENCOUNTER — Ambulatory Visit
Admission: RE | Admit: 2021-04-29 | Discharge: 2021-04-29 | Disposition: A | Discharge status: Home / Self Care | Payer: No Typology Code available for payment source | Source: Ambulatory Visit | Attending: Family Medicine | Admitting: Family Medicine

## 2021-04-29 DIAGNOSIS — S8991XA Unspecified injury of right lower leg, initial encounter: Secondary | ICD-10-CM

## 2021-10-14 IMAGING — CR DG KNEE 1-2V*R*
2 series · 2 of 2 positions shown · non-contrast
Comparison: 09/17/2011

CLINICAL DATA: Blunt force trauma to anterior patella 1 day ago,
pain

EXAM:
RIGHT KNEE - 1-2 VIEW

[w knee ap right]
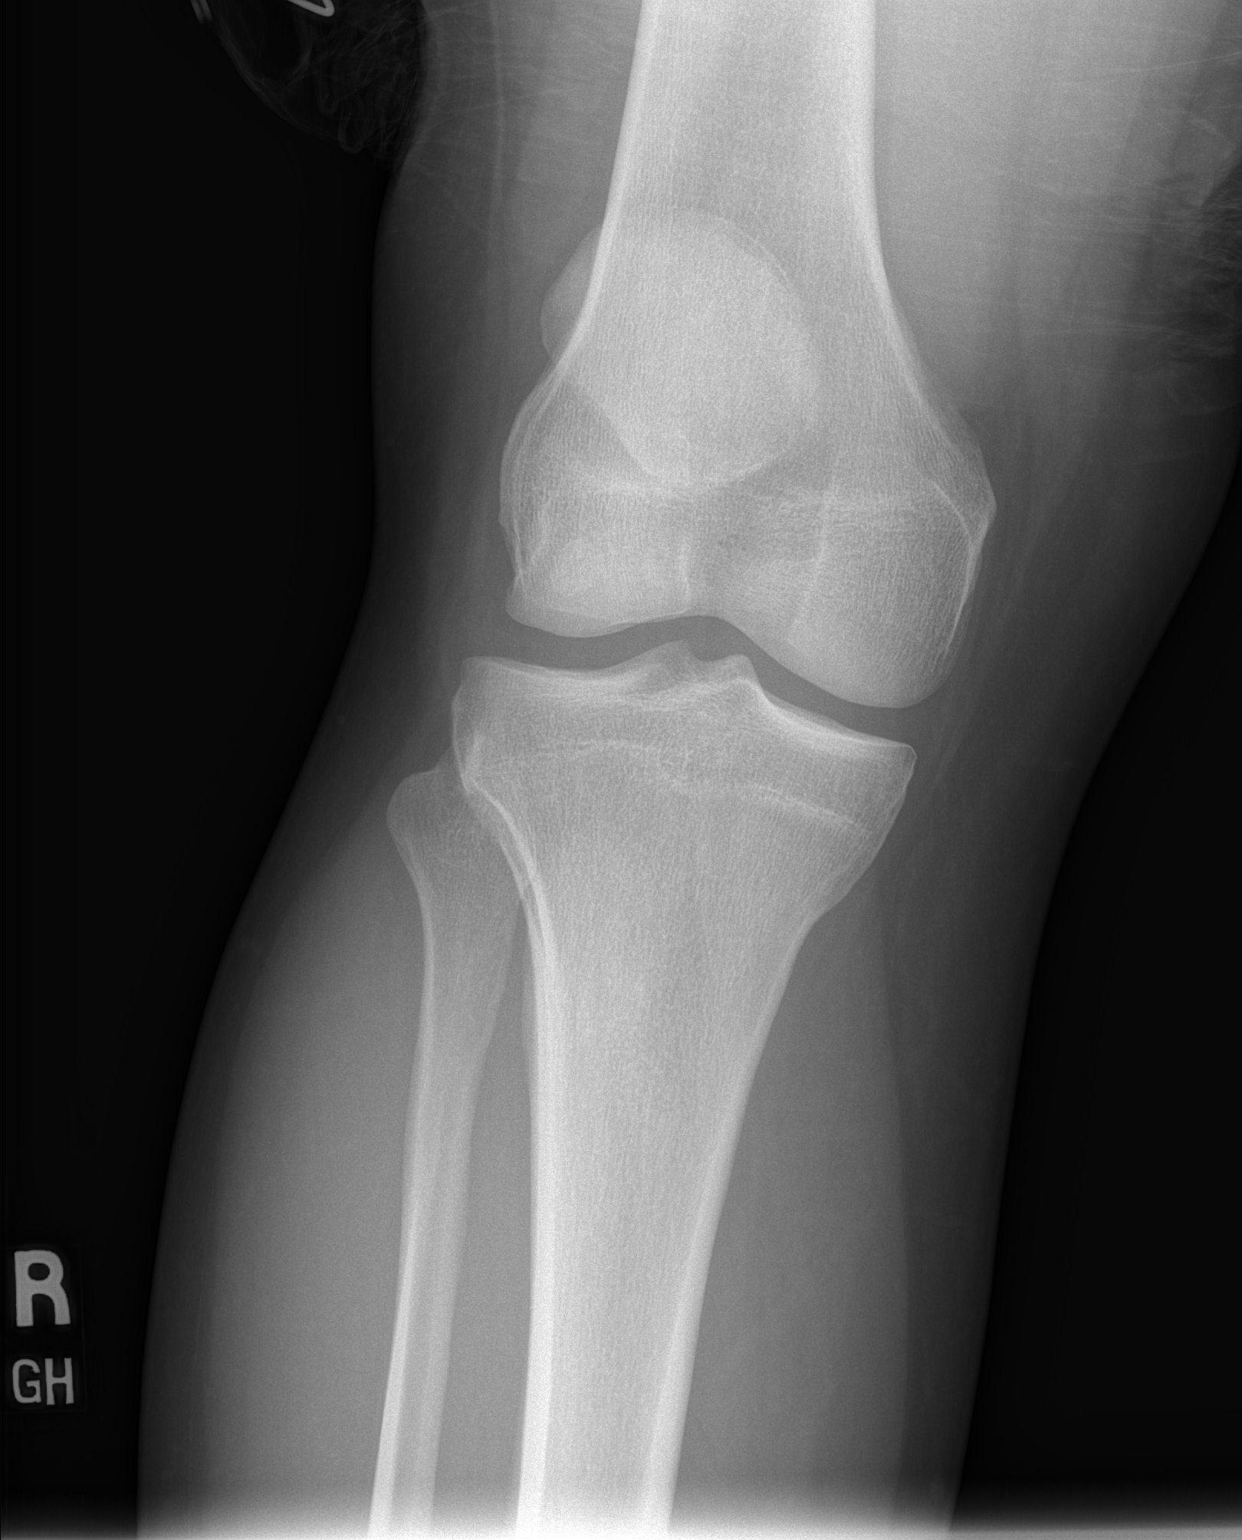

[w knee lat. right]
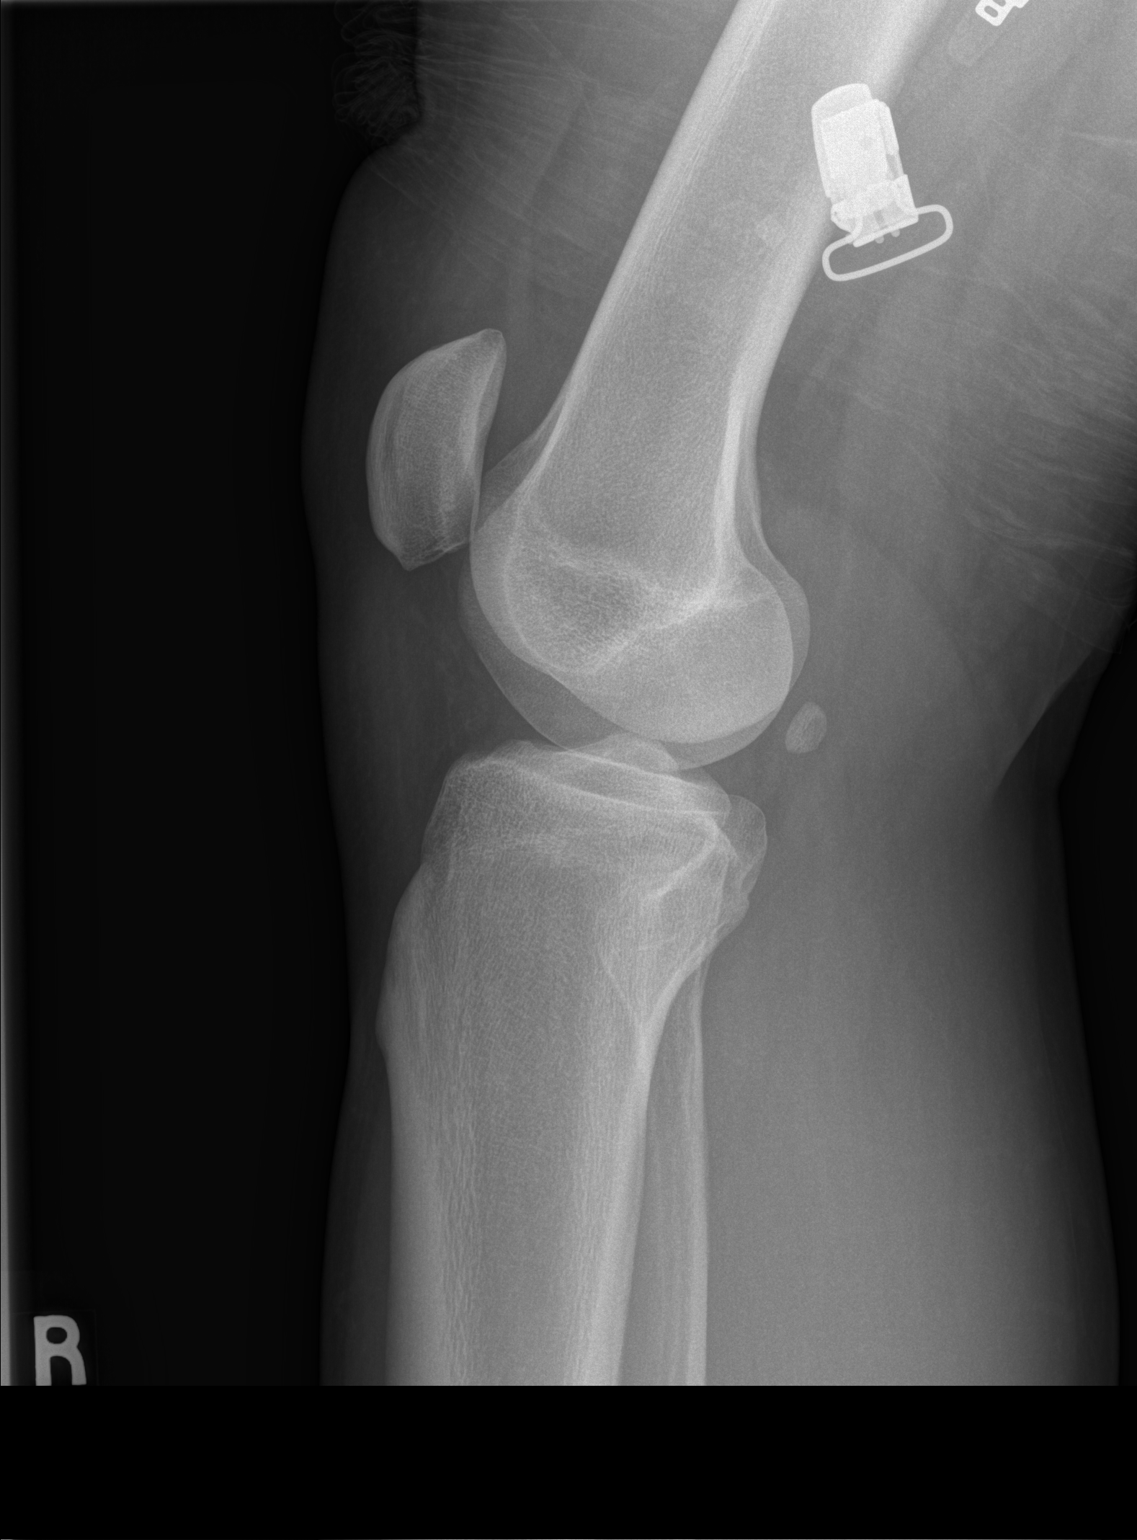

[2 of 2 positions shown; findings below may reference images not displayed]

FINDINGS: Clothing artifacts distal thigh.

Osseous mineralization normal.

Joint spaces preserved.

No fracture, dislocation, or bone destruction.

No joint effusion.
IMPRESSION: Normal exam.

## 2023-06-09 ENCOUNTER — Ambulatory Visit: Payer: 59 | Admitting: Nurse Practitioner
